# Patient Record
Sex: Male | Born: 1970 | Race: Black or African American | Hispanic: No | Marital: Single | Smoking: Current every day smoker
Health system: Southern US, Community
[De-identification: ages and names within clinical notes are randomized; demographics above are authoritative.]

## PROBLEM LIST (undated history)

## (undated) DIAGNOSIS — I10 Essential (primary) hypertension: Secondary | ICD-10-CM

## (undated) DIAGNOSIS — Y249XXA Unspecified firearm discharge, undetermined intent, initial encounter: Secondary | ICD-10-CM

## (undated) DIAGNOSIS — K911 Postgastric surgery syndromes: Secondary | ICD-10-CM

## (undated) DIAGNOSIS — W3400XA Accidental discharge from unspecified firearms or gun, initial encounter: Secondary | ICD-10-CM

## (undated) HISTORY — PX: ABDOMINAL SURGERY: SHX537

## (undated) HISTORY — PX: COLOSTOMY REVERSAL: SHX5782

## (undated) HISTORY — PX: COLOSTOMY: SHX63

---

## 2010-01-26 ENCOUNTER — Emergency Department (HOSPITAL_COMMUNITY): Admission: EM | Admit: 2010-01-26 | Discharge: 2010-01-26 | Payer: Self-pay | Admitting: Emergency Medicine

## 2010-07-24 LAB — COMPREHENSIVE METABOLIC PANEL
ALT: 18 U/L (ref 0–53)
AST: 40 U/L — ABNORMAL HIGH (ref 0–37)
Albumin: 3.8 g/dL (ref 3.5–5.2)
CO2: 27 mEq/L (ref 19–32)
Calcium: 8.9 mg/dL (ref 8.4–10.5)
GFR calc Af Amer: >60 mL/min (ref >60)
GFR calc non Af Amer: 58 mL/min — ABNORMAL LOW (ref >60)
Sodium: 137 mEq/L (ref 135–145)

## 2010-07-24 LAB — CBC
Hemoglobin: 12.8 g/dL — ABNORMAL LOW (ref 13.0–17.0)
MCHC: 34.7 g/dL (ref 30.0–36.0)
RBC: 4.39 MIL/uL (ref 4.22–5.81)

## 2010-08-27 ENCOUNTER — Emergency Department (HOSPITAL_COMMUNITY)
Admission: EM | Admit: 2010-08-27 | Discharge: 2010-08-27 | Disposition: A | Discharge status: Home / Self Care | Payer: Self-pay | Attending: Emergency Medicine | Admitting: Emergency Medicine

## 2010-08-27 DIAGNOSIS — H5710 Ocular pain, unspecified eye: Secondary | ICD-10-CM | POA: Insufficient documentation

## 2010-08-27 DIAGNOSIS — H53149 Visual discomfort, unspecified: Secondary | ICD-10-CM | POA: Insufficient documentation

## 2010-08-27 DIAGNOSIS — F411 Generalized anxiety disorder: Secondary | ICD-10-CM | POA: Insufficient documentation

## 2010-08-27 DIAGNOSIS — T1500XA Foreign body in cornea, unspecified eye, initial encounter: Secondary | ICD-10-CM | POA: Insufficient documentation

## 2010-08-27 DIAGNOSIS — T1590XA Foreign body on external eye, part unspecified, unspecified eye, initial encounter: Secondary | ICD-10-CM | POA: Insufficient documentation

## 2010-08-27 DIAGNOSIS — H5789 Other specified disorders of eye and adnexa: Secondary | ICD-10-CM | POA: Insufficient documentation

## 2010-08-27 DIAGNOSIS — F319 Bipolar disorder, unspecified: Secondary | ICD-10-CM | POA: Insufficient documentation

## 2010-08-27 DIAGNOSIS — H11419 Vascular abnormalities of conjunctiva, unspecified eye: Secondary | ICD-10-CM | POA: Insufficient documentation

## 2010-08-27 DIAGNOSIS — H538 Other visual disturbances: Secondary | ICD-10-CM | POA: Insufficient documentation

## 2010-11-15 ENCOUNTER — Emergency Department (HOSPITAL_COMMUNITY)
Admission: EM | Admit: 2010-11-15 | Discharge: 2010-11-15 | Disposition: A | Discharge status: Home / Self Care | Payer: Self-pay | Attending: Emergency Medicine | Admitting: Emergency Medicine

## 2010-11-15 DIAGNOSIS — X58XXXA Exposure to other specified factors, initial encounter: Secondary | ICD-10-CM | POA: Insufficient documentation

## 2010-11-15 DIAGNOSIS — T148XXA Other injury of unspecified body region, initial encounter: Secondary | ICD-10-CM | POA: Insufficient documentation

## 2010-11-15 DIAGNOSIS — M549 Dorsalgia, unspecified: Secondary | ICD-10-CM | POA: Insufficient documentation

## 2015-06-14 ENCOUNTER — Encounter (HOSPITAL_COMMUNITY): Payer: Self-pay | Admitting: Emergency Medicine

## 2015-06-14 ENCOUNTER — Emergency Department (HOSPITAL_COMMUNITY)
Admission: EM | Admit: 2015-06-14 | Discharge: 2015-06-15 | Disposition: A | Discharge status: Home / Self Care | Payer: Self-pay | Attending: Emergency Medicine | Admitting: Emergency Medicine

## 2015-06-14 ENCOUNTER — Emergency Department (HOSPITAL_COMMUNITY): Payer: Self-pay

## 2015-06-14 DIAGNOSIS — Z87828 Personal history of other (healed) physical injury and trauma: Secondary | ICD-10-CM | POA: Insufficient documentation

## 2015-06-14 DIAGNOSIS — Z79899 Other long term (current) drug therapy: Secondary | ICD-10-CM | POA: Insufficient documentation

## 2015-06-14 DIAGNOSIS — R079 Chest pain, unspecified: Secondary | ICD-10-CM | POA: Insufficient documentation

## 2015-06-14 DIAGNOSIS — F17210 Nicotine dependence, cigarettes, uncomplicated: Secondary | ICD-10-CM | POA: Insufficient documentation

## 2015-06-14 DIAGNOSIS — R202 Paresthesia of skin: Secondary | ICD-10-CM | POA: Insufficient documentation

## 2015-06-14 DIAGNOSIS — I10 Essential (primary) hypertension: Secondary | ICD-10-CM | POA: Insufficient documentation

## 2015-06-14 DIAGNOSIS — R253 Fasciculation: Secondary | ICD-10-CM | POA: Insufficient documentation

## 2015-06-14 DIAGNOSIS — H9312 Tinnitus, left ear: Secondary | ICD-10-CM | POA: Insufficient documentation

## 2015-06-14 HISTORY — DX: Essential (primary) hypertension: I10

## 2015-06-14 HISTORY — DX: Accidental discharge from unspecified firearms or gun, initial encounter: W34.00XA

## 2015-06-14 HISTORY — DX: Unspecified firearm discharge, undetermined intent, initial encounter: Y24.9XXA

## 2015-06-14 LAB — CBC
HEMATOCRIT: 41.9 % (ref 39.0–52.0)
HEMOGLOBIN: 14.1 g/dL (ref 13.0–17.0)
MCH: 28.1 pg (ref 26.0–34.0)
MCHC: 33.7 g/dL (ref 30.0–36.0)
MCV: 83.6 fL (ref 78.0–100.0)
Platelets: 216 10*3/uL (ref 150–400)
RBC: 5.01 MIL/uL (ref 4.22–5.81)
RDW: 12.5 % (ref 11.5–15.5)
WBC: 4.2 10*3/uL (ref 4.0–10.5)

## 2015-06-14 LAB — BASIC METABOLIC PANEL
ANION GAP: 11 (ref 5–15)
BUN: 5 mg/dL — ABNORMAL LOW (ref 6–20)
CALCIUM: 9.5 mg/dL (ref 8.9–10.3)
CO2: 29 mmol/L (ref 22–32)
Chloride: 101 mmol/L (ref 101–111)
Creatinine, Ser: 1.41 mg/dL — ABNORMAL HIGH (ref 0.61–1.24)
GFR calc non Af Amer: 60 mL/min — ABNORMAL LOW (ref >60)
GLUCOSE: 97 mg/dL (ref 65–99)
POTASSIUM: 4 mmol/L (ref 3.5–5.1)
Sodium: 141 mmol/L (ref 135–145)

## 2015-06-14 LAB — I-STAT TROPONIN, ED
TROPONIN I, POC: 0 ng/mL (ref 0.00–0.08)
Troponin i, poc: 0 ng/mL (ref 0.00–0.08)

## 2015-06-14 MED ORDER — DIAZEPAM 5 MG PO TABS
5.0000 mg | ORAL_TABLET | Freq: Once | ORAL | Status: AC
  Administered 2015-06-14: 5 mg via ORAL
  Filled 2015-06-14: qty 1

## 2015-06-14 NOTE — ED Notes (Signed)
Pt from home for eval of sudden onset of tingling to bilateral arms and legs that went into his chest yesterday as pt was resting. Pt denies any cp at this time but reports tingling to fingertips. nad noted. Alert and oriented.
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
## 2015-06-15 MED ORDER — DIAZEPAM 5 MG PO TABS: 5.0000 mg | ORAL_TABLET | Freq: Two times a day (BID) | ORAL | Status: DC | PRN

## 2015-06-15 NOTE — Discharge Instructions (Signed)
Take the prescribed medication as directed for muscle spasm/twitches. Follow-up with your primary care physician.  If you do not have one, see resource guide. Return to the ED for new or worsening symptoms.   Emergency Department Resource Guide 1) Find a Doctor and Pay Out of Pocket Although you won't have to find out who is covered by your insurance plan, it is a good idea to ask around and get recommendations. You will then need to call the office and see if the doctor you have chosen will accept you as a new patient and what types of options they offer for patients who are self-pay. Some doctors offer discounts or will set up payment plans for their patients who do not have insurance, but you will need to ask so you aren't surprised when you get to your appointment.  2) Contact Your Local Health Department Not all health departments have doctors that can see patients for sick visits, but many do, so it is worth a call to see if yours does. If you don't know where your local health department is, you can check in your phone book. The CDC also has a tool to help you locate your state's health department, and many state websites also have listings of all of their local health departments.  3) Find a Walk-in Clinic If your illness is not likely to be very severe or complicated, you may want to try a walk in clinic. These are popping up all over the country in pharmacies, drugstores, and shopping centers. They're usually staffed by nurse practitioners or physician assistants that have been trained to treat common illnesses and complaints. They're usually fairly quick and inexpensive. However, if you have serious medical issues or chronic medical problems, these are probably not your best option.  No Primary Care Doctor: - Call Health Connect at  [phone] - they can help you locate a primary care doctor that  accepts your insurance, provides certain services, etc. - Physician Referral Service-  [phone]  Chronic Pain Problems: Organization         Address  Phone   Notes  [facility]  [phone] Patients need to be referred by their primary care doctor.   Medication Assistance: Organization         Address  Phone   Notes  [facility] [address] [place] [place], [place] [address] [phone] --Must be a resident of [facility] -- Must have NO insurance coverage whatsoever (no Medicaid/ Medicare, etc.) -- The pt. MUST have a primary care doctor that directs their care regularly and follows them in the community   [facility]ist  [phone]   [employer]  [phone]    Agencies that provide inexpensive medical care: Organization         Address  Phone   Notes  [facility]  [phone]   [facility]    [phone]   [facility] [address] [place], [place] [address] [phone]   [facility] [address] [place] [place] [phone]   Planned Parenthood    [phone]   [facility]    [phone]   [facility]  [address], [place] Phone:  [phone], Fax:  [phone] Hours of Operation:  9 am - 6 pm, M-F.  Also accepts Medicaid/Medicare and self-pay.  [facility]  [address], [place] Phone: [phone], Fax: [phone]. Hours of Operation:  8:30 am - 5:30 pm, M-F.  Also accepts Medicaid and self-pay.  [facility] [place] [address] [place] Phone: [phone]   [place], [place], [place] [phone], Ext. [phone] Mondays & Thursdays: 7-9 AM.  First 15 patients are seen on a first come, first serve basis.    [place] Providers:  Organization         Address  Phone   Notes  [facility] [address],  [place] [phone] Also accepts self-pay patients.  [facility] [id number] [address]. [address]. Garcia, [place]  [phone]   [place], [address], [place] [phone]   [facility] [address] [place] [phone]   Lucianne Lei [address], [place]   [phone] Only accepts [place] Access [place] patients after they have their name applied to their card.   Self-Pay (no insurance) in [facility]:  Organization         Address  Phone   Notes  Sickle Cell Patients, [facility] Internal Medicine [place] [phone]   [facility] Urgent Care [place] [phone]   [facility] [place]  [place], [place], [place] [phone]   [facility]/Dr. Osei-Bonsu  [address] [place] or [address], [place] [phone] Phone number for both [place] and [place] locations is the same.  [facility] [address] [place] [phone]   [facility] [address] [place] or [address] [place] [address] [phone]   [facility] [address] [place] [phone], phone; [phone], fax Sees patients 1st and 3rd Saturday of every month.  Must not qualify for public or private insurance (i.e. Medicaid, Medicare, [place] Health Choice, Veterans' Benefits)  Household income should be no more than 200% of the poverty level The clinic cannot treat you if you are pregnant or think you are pregnant  Sexually transmitted diseases are not treated at the clinic.    Dental Care: Organization         Address  Phone  Notes  [facility] Department of [facility] [phone] Accepts children up to age 64 who are enrolled in  [place] or [place]; pregnant women with a Medicaid card; and children who have applied for Medicaid or [place] Health Choice, but were declined, whose parents can pay a reduced fee at time of service.  [facility] Department of [facility]  [address], [place] [phone] Accepts children up to age 59 who are enrolled in [place] or [place]; pregnant women with a Medicaid card; and children who have applied for Medicaid or [place] Health Choice, but were declined, whose parents can pay a reduced fee at time of service.  [facility]  [place] [phone] Patients are seen by appointment only. Walk-ins are not accepted. [place] will see patients 70 years of age and older. Monday - Tuesday (8am-5pm) Most Wednesdays (8:30-5pm) $30 per visit, cash only  [facility]  [address], [facility] [phone] Patients are seen by appointment only. Walk-ins are not accepted. [place] will see patients 55 years of age and older. One Wednesday Evening (Monthly: Volunteer Based).  $30 per visit, cash only  [place]  [phone] for adults; Children under age 16, call Graduate Pediatric Dentistry at [phone]. Children aged 15-14, please call [phone] to request a pediatric application.  Dental services are provided in all areas of dental care including fillings, crowns and bridges, complete and partial dentures, implants, gum treatment, root canals, and extractions. Preventive care is also provided. Treatment is provided to both adults and children. Patients are selected via a lottery and there is often a waiting list.   [facility] [address] [place]  [phone] [email or web address]   [facility] [address] [place], [place] [phone], Ext. [phone] Second and Fourth Thursday of each month, opens at 6:30  AM; Clinic ends at 9 AM.  Patients are seen on a first-come first-served basis, and a limited number are seen during each clinic.   [facility]  [address] [place]ard Danker [place], [place] [phone]   Eligibility Requirements You must have lived in [place], [place], or [place] counties for at least the last three months.   You cannot be eligible for state or federal sponsored [employer], including [employer], [place], or [employer].   You generally cannot be eligible for healthcare insurance through your employer.    How to apply: Eligibility screenings are held every Tuesday and Wednesday afternoon from 1:00 pm until 4:00 pm. You do not need an appointment for the interview!  [facility] [address] [place], [place]   [employer]  [place] Department  [place]  [phone]    Behavioral Health Resources in the Community: Intensive Outpatient Programs Organization         Address  Phone  Notes  [place]. [address], [place], [place] [phone]   [facility] [address], [place], [employer]   ADS: Alcohol & Drug S[facility] [address], [place], [place]   [place] [address]  [place], [place] or [phone]   Substance Abuse Resources Organization         Address  Phone  Notes  Alcohol and Drug Services  [phone]   [place]  [phone]   [facility] [place]   Chinita Pester  [phone]   Residential & Outpatient Substance Abuse Program  [phone]   Psychological Services Organization         Address  Phone  Notes  [facility] [place]  [place]  [phone]   [place] [address] [place] [phone] or  [phone]    Mobile Crisis Teams Organization         Address  Phone  Notes  [facility], Mobile Crisis Care Unit  [phone]   [facility]  [address] [place], [place]   [place] [address] [place] [phone]    Self-Help/Support Groups Organization         Address  Phone             Notes  [facility]ental Health Assoc[facility] of [place] - variety of support groups  [place] Call for more information  Narcotics Anonymous (NA), [facility] [address], [employer] [place]  2 meetings at this location   Special educational needs teacher         Address  Phone  Notes  [facility] [place],    [place]  [phone]   [facility]  [address], [place] [address], [place], [place]   [place], [place] [phone] Admissions: 8am-3pm M-F  Incentives Substance [place] [address],    [place], [place] [phone]   [facility] [address] [place], [place], [place]   [facility] [address],  [place], [place]   [facility] - Intensive Outpatient [address], [place], [place]   [facility] ([place].) [place].,  [place], [place] [phone] or [phone]   [facility] ([facility]S) [address] [place], [place] Accepts Medicaid  [facility] [place] [address]  [place] [phone] Substance Abuse/Addiction Treatment   [facility] Organization         Address  Phone  Notes  [facility]  [phone]   Domenic Schwab, PhD [address] Arlis Porta [place], [place]   [phone] or [phone]   [place], [place] [phone]   [facility] [address],  [place], [place] [phone] Insurance/Medicaid/sponsorship through [facility] and Families [address]                                    [place], [place] [phone] [place] [address], [place] [phone]    Dr. Adele Schilder  [phone]   [facility] Dept. 1) [address] [place] 2) [place] 3)  [place] [address], [place] [phone]  [phone]   [place] [phone] or [phone] (After Hours)

## 2015-06-15 NOTE — ED Notes (Signed)
Pt left with all his belongings and ambulated out of the treatment area.  

## 2015-07-20 ENCOUNTER — Encounter (HOSPITAL_COMMUNITY): Payer: Self-pay

## 2015-07-20 ENCOUNTER — Emergency Department (HOSPITAL_COMMUNITY)
Admission: EM | Admit: 2015-07-20 | Discharge: 2015-07-20 | Disposition: A | Discharge status: Home / Self Care | Payer: Self-pay | Attending: Emergency Medicine | Admitting: Emergency Medicine

## 2015-07-20 DIAGNOSIS — F17210 Nicotine dependence, cigarettes, uncomplicated: Secondary | ICD-10-CM | POA: Insufficient documentation

## 2015-07-20 DIAGNOSIS — T889XXA Complication of surgical and medical care, unspecified, initial encounter: Secondary | ICD-10-CM

## 2015-07-20 DIAGNOSIS — H18821 Corneal disorder due to contact lens, right eye: Secondary | ICD-10-CM | POA: Insufficient documentation

## 2015-07-20 DIAGNOSIS — Z87828 Personal history of other (healed) physical injury and trauma: Secondary | ICD-10-CM | POA: Insufficient documentation

## 2015-07-20 DIAGNOSIS — Z79899 Other long term (current) drug therapy: Secondary | ICD-10-CM | POA: Insufficient documentation

## 2015-07-20 DIAGNOSIS — I10 Essential (primary) hypertension: Secondary | ICD-10-CM | POA: Insufficient documentation

## 2015-07-20 MED ORDER — FLUORESCEIN SODIUM 1 MG OP STRP
1.0000 | ORAL_STRIP | Freq: Once | OPHTHALMIC | Status: AC
  Administered 2015-07-20: 1 via OPHTHALMIC
  Filled 2015-07-20: qty 1

## 2015-07-20 MED ORDER — TETRACAINE HCL 0.5 % OP SOLN
1.0000 [drp] | Freq: Once | OPHTHALMIC | Status: AC
  Administered 2015-07-20: 1 [drp] via OPHTHALMIC
  Filled 2015-07-20: qty 2

## 2015-07-20 MED ORDER — FLUORESCEIN SODIUM 1 MG OP STRP: 1.0000 | ORAL_STRIP | Freq: Once | OPHTHALMIC | Status: DC

## 2015-07-20 MED ORDER — CIPROFLOXACIN HCL 0.3 % OP SOLN: 1.0000 [drp] | OPHTHALMIC | Status: DC
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
## 2015-07-20 NOTE — Discharge Instructions (Signed)
Schedule a follow-up appointment with your ophthalmologist for a visit in the next 2-3 days. Return to the emergency department with worsening pain in the eye, worsening vision, loss of vision, redness of the eye, purulent discharge from the eye, or any new or concerning symptoms.  Ciprofloxacin eye solution What is this medicine? CIPROFLOXACIN (sip roe FLOX a sin) is a quinolone antibiotic. It is used to treat bacterial eye infections. This medicine may be used for other purposes; ask your health care provider or pharmacist if you have questions. What should I tell my health care provider before I take this medicine? They need to know if you have any of these conditions: -contact lens wearer -an unusual or allergic reaction to ciprofloxacin, other antibiotics or medicines, foods, dyes, or preservatives -pregnant or trying to get pregnant -breast-feeding How should I use this medicine? This medicine is only for use in the eye. Follow the directions on the prescription label. Wash hands before and after use. Try not to touch the tip of the dropper to anything, even your eye or fingertips.Tilt your head back slightly and pull your lower eyelid down with your index finger to form a pouch. Squeeze the prescribed number of drops into the pouch. Close the eye gently to spread the drops. Your vision may blur for a few minutes. Use your doses at regular intervals. Do not use your medicine more often than directed. Finish the full course that is prescribed even if you think your condition is better. Do not skip doses or stop your medicine early. Talk to your pediatrician regarding the use of this medicine in children. Special care may be needed. Overdosage: If you think you have taken too much of this medicine contact a poison control center or emergency room at once. NOTE: This medicine is only for you. Do not share this medicine with others. What if I miss a dose? If you miss a dose, use it as soon as you  can. If it is almost time for your next dose, use only that dose. Do not use double or extra doses. What may interact with this medicine? Interactions are not expected. Do not use any other eye products without telling your doctor or health care professional. This list may not describe all possible interactions. Give your health care provider a list of all the medicines, herbs, non-prescription drugs, or dietary supplements you use. Also tell them if you smoke, drink alcohol, or use illegal drugs. Some items may interact with your medicine. What should I watch for while using this medicine? Tell your doctor or health care professional if your symptoms do not improve in 2 to 3 days or if they get worse. If your eyes are more sensitive to light, wear sunglasses. Do not wear contact lenses while you have any signs or symptoms of an eye infection. Ask your doctor or health care professional when you can start wearing your contacts again. Stop using this medicine immediately if you notice signs of an allergic reaction. What side effects may I notice from receiving this medicine? Side effects that you should report to your doctor or health care professional as soon as possible: -allergic reactions like skin rash, itching or hives, swelling of the face, lips, or tongue -blurred vision that does not go away Side effects that usually do not require medical attention (report to your doctor or health care professional if they continue or are bothersome): -temporary blurred vision -tearing or feeling of something in the eye This list  may not describe all possible side effects. Call your doctor for medical advice about side effects. You may report side effects to FDA at 1-800-FDA-1088. Where should I keep my medicine? Keep out of the reach of children. Store at room temperature between 2 and 25 degrees C (36 and 77 degrees F). Protect from light. Throw away any unused medicine after the expiration date. NOTE:  This sheet is a summary. It may not cover all possible information. If you have questions about this medicine, talk to your doctor, pharmacist, or health care provider.    2016, Elsevier/Gold Standard. (2007-08-02 14:07:51) Artificial Tears eye solution What is this medicine? ARTIFICIAL TEARS (ahr tuh FISH uhl teerz) eye solution soothes irritation and discomfort caused by dry eyes. This medicine may be used for other purposes; ask your health care provider or pharmacist if you have questions. What should I tell my health care provider before I take this medicine? -change in vision -eye infection or trauma -wear contact lenses -an unusual or allergic reaction to artificial tears, other medicines, foods, dyes, or preservatives -pregnant or trying to get pregnant -breast-feeding How should I use this medicine? This medicine is only for use in the eye. Do not take by mouth. Follow the directions on the label. Wash hands before and after use. Tilt the head back slightly and pull down the lower eyelid with your index finger to form a pouch. Try not to touch the tip of the dropper to your eye, fingertips, or any other surface. Squeeze the prescribed number of drops (usually one or two drops) into the pouch. Close the eye gently for a few moments to allow the drops to be in contact with the eye. Use your medicine at regular intervals. Do not use your medicine more often than directed. Talk to your pediatrician regarding the use of this medicine in children. While this medicine may be used in children as young as 6 years for selected conditions, precautions do apply. Overdosage: If you think you have taken too much of this medicine contact a poison control center or emergency room at once. NOTE: This medicine is only for you. Do not share this medicine with others. What if I miss a dose? If you miss a dose, use it as soon as you can. If it is almost time for your next dose, use only that dose. Do not  use double or extra doses. What may interact with this medicine? Interactions are not expected. If you are using other eye drops with this medicine, separate the application of the different eye drops by roughly 5 minutes. This ensures that the eye drops do not interfere with each other. If you are using both eye drops and an eye ointment, use the eye drops 10 minutes before the eye ointment so that the eye ointment does not interfere with the action of the drops. This list may not describe all possible interactions. Give your health care provider a list of all the medicines, herbs, non-prescription drugs, or dietary supplements you use. Also tell them if you smoke, drink alcohol, or use illegal drugs. Some items may interact with your medicine. What should I watch for while using this medicine? If you experience eye pain, changes in vision, continued redness or irritation of the eye, or if your eye condition gets worse or lasts longer than 72 hours, discontinue use and consult your health care professional. To avoid contamination of this product, do not touch the tip of the container to any surface.  Do not share this medicine with others. If the product changes color or becomes cloudy, do not use. If you wear contact lenses, you should remove them before putting the drops in your eyes. Wait at least 15 minutes after putting the drops in your eyes before putting your contact lenses back in. What side effects may I notice from receiving this medicine? Side effects that you should report to your doctor or health care professional as soon as possible: -allergic reactions like skin rash, itching or hives, swelling of the face, lips, or tongue -change in vision -eye irritation or redness that gets worse or lasts more than 72 hours -eye pain Side effects that usually do not require medical attention (report to your doctor or health care professional if they continue or are bothersome): -temporary stinging  or blurred vision when applying the eye drops This list may not describe all possible side effects. Call your doctor for medical advice about side effects. You may report side effects to FDA at 1-800-FDA-1088. Where should I keep my medicine? Keep out of the reach of children. Store at room temperature between 15 and 30 degrees C (59 and 86 degrees F). Do not freeze. Throw away any unused medicine after the expiration date. Once the product is opened, most experts recommend discarding the product after 30 days. NOTE: This sheet is a summary. It may not cover all possible information. If you have questions about this medicine, talk to your doctor, pharmacist, or health care provider.    2016, Elsevier/Gold Standard. (2007-10-28 14:24:03)
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
## 2015-08-04 ENCOUNTER — Encounter (HOSPITAL_COMMUNITY): Payer: Self-pay | Admitting: Emergency Medicine

## 2015-08-04 ENCOUNTER — Emergency Department (HOSPITAL_COMMUNITY)
Admission: EM | Admit: 2015-08-04 | Discharge: 2015-08-05 | Disposition: A | Discharge status: Home / Self Care | Payer: Self-pay | Attending: Emergency Medicine | Admitting: Emergency Medicine

## 2015-08-04 DIAGNOSIS — R200 Anesthesia of skin: Secondary | ICD-10-CM | POA: Insufficient documentation

## 2015-08-04 DIAGNOSIS — Z87828 Personal history of other (healed) physical injury and trauma: Secondary | ICD-10-CM | POA: Insufficient documentation

## 2015-08-04 DIAGNOSIS — I10 Essential (primary) hypertension: Secondary | ICD-10-CM | POA: Insufficient documentation

## 2015-08-04 DIAGNOSIS — Z79899 Other long term (current) drug therapy: Secondary | ICD-10-CM | POA: Insufficient documentation

## 2015-08-04 DIAGNOSIS — F17210 Nicotine dependence, cigarettes, uncomplicated: Secondary | ICD-10-CM | POA: Insufficient documentation

## 2015-08-04 DIAGNOSIS — R42 Dizziness and giddiness: Secondary | ICD-10-CM | POA: Insufficient documentation

## 2015-08-04 LAB — BASIC METABOLIC PANEL
Anion gap: 7 (ref 5–15)
BUN: 11 mg/dL (ref 6–20)
CHLORIDE: 106 mmol/L (ref 101–111)
CO2: 26 mmol/L (ref 22–32)
CREATININE: 1.32 mg/dL — AB (ref 0.61–1.24)
Calcium: 9.2 mg/dL (ref 8.9–10.3)
GFR calc non Af Amer: >60 mL/min (ref >60)
Glucose, Bld: 106 mg/dL — ABNORMAL HIGH (ref 65–99)
POTASSIUM: 3.6 mmol/L (ref 3.5–5.1)
Sodium: 139 mmol/L (ref 135–145)

## 2015-08-04 LAB — CBC
HEMATOCRIT: 36.7 % — AB (ref 39.0–52.0)
Hemoglobin: 11.8 g/dL — ABNORMAL LOW (ref 13.0–17.0)
MCH: 27.1 pg (ref 26.0–34.0)
MCHC: 32.2 g/dL (ref 30.0–36.0)
MCV: 84.2 fL (ref 78.0–100.0)
PLATELETS: 230 10*3/uL (ref 150–400)
RBC: 4.36 MIL/uL (ref 4.22–5.81)
RDW: 12.8 % (ref 11.5–15.5)
WBC: 4.9 10*3/uL (ref 4.0–10.5)
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
## 2015-08-04 NOTE — ED Notes (Signed)
MD at bedside. 

## 2015-08-05 ENCOUNTER — Emergency Department (HOSPITAL_COMMUNITY): Payer: Self-pay

## 2015-08-05 LAB — URINALYSIS, ROUTINE W REFLEX MICROSCOPIC
Bilirubin Urine: NEGATIVE
GLUCOSE, UA: NEGATIVE mg/dL
Hgb urine dipstick: NEGATIVE
Ketones, ur: NEGATIVE mg/dL
LEUKOCYTES UA: NEGATIVE
Nitrite: NEGATIVE
PH: 7 (ref 5.0–8.0)
Protein, ur: NEGATIVE mg/dL
SPECIFIC GRAVITY, URINE: 1.007 (ref 1.005–1.030)

## 2015-08-05 LAB — CBG MONITORING, ED: Glucose-Capillary: 80 mg/dL (ref 65–99)

## 2015-08-05 NOTE — ED Notes (Signed)
Patient transported to X-ray 

## 2015-08-05 NOTE — Discharge Instructions (Signed)
Dizziness [name], your work up today was normal.  This is likely a side effect from your medication.  Call your primary care doctor first thing in the morning to obtain a new medicine.  If any symptoms worsen, come back to the ED immediately. Thank you. Dizziness is a common problem. It makes you feel unsteady or lightheaded. You may feel like you are about to pass out (faint). Dizziness can lead to injury if you stumble or fall. Anyone can get dizzy, but dizziness is more common in older adults. This condition can be caused by a number of things, including:  Medicines.  Dehydration.  Illness. HOME CARE Following these instructions may help with your condition: Eating and Drinking  Drink enough fluid to keep your pee (urine) clear or pale yellow. This helps to keep you from getting dehydrated. Try to drink more clear fluids, such as water.  Do not drink alcohol.  Limit how much caffeine you drink or eat if told by your doctor.  Limit how much salt you drink or eat if told by your doctor. Activity  Avoid making quick movements.  When you stand up from sitting in a chair, steady yourself until you feel okay.  In the morning, first sit up on the side of the bed. When you feel okay, stand slowly while you hold onto something. Do this until you know that your balance is fine.  Move your legs often if you need to stand in one place for a long time. Tighten and relax your muscles in your legs while you are standing.  Do not drive or use heavy machinery if you feel dizzy.  Avoid bending down if you feel dizzy. Place items in your home so that they are easy for you to reach without leaning over. Lifestyle  Do not use any tobacco products, including cigarettes, chewing tobacco, or electronic cigarettes. If you need help quitting, ask your doctor.  Try to lower your stress level, such as with yoga or meditation. Talk with your doctor if you need help. General Instructions  Watch  your dizziness for any changes.  Take medicines only as told by your doctor. Talk with your doctor if you think that your dizziness is caused by a medicine that you are taking.  Tell a friend or a family member that you are feeling dizzy. If he or she notices any changes in your behavior, have this person call your doctor.  Keep all follow-up visits as told by your doctor. This is important. GET HELP IF:  Your dizziness does not go away.  Your dizziness or light-headedness gets worse.  You feel sick to your stomach (nauseous).  You have trouble hearing.  You have new symptoms.  You are unsteady on your feet or you feel like the room is spinning. GET HELP RIGHT AWAY IF:  You throw up (vomit) or have diarrhea and are unable to eat or drink anything.  You have trouble:  Talking.  Walking.  Swallowing.  Using your arms, hands, or legs.  You feel generally weak.  You are not thinking clearly or you have trouble forming sentences. It may take a friend or family member to notice this.  You have:  Chest pain.  Pain in your belly (abdomen).  Shortness of breath.  Sweating.  Your vision changes.  You are bleeding.  You have a headache.  You have neck pain or a stiff neck.  You have a fever.   This information is not intended  to replace advice given to you by your health care provider. Make sure you discuss any questions you have with your health care provider.   Document Released: 04/16/2011 Document Revised: 09/11/2014 Document Reviewed: 04/23/2014 Elsevier Interactive Patient Education [employer]

## 2015-10-06 ENCOUNTER — Encounter (HOSPITAL_COMMUNITY): Payer: Self-pay | Admitting: *Deleted

## 2015-10-06 ENCOUNTER — Ambulatory Visit (HOSPITAL_COMMUNITY)
Admission: EM | Admit: 2015-10-06 | Discharge: 2015-10-06 | Disposition: A | Discharge status: Home / Self Care | Payer: Self-pay | Attending: Emergency Medicine | Admitting: Emergency Medicine

## 2015-10-06 DIAGNOSIS — K648 Other hemorrhoids: Secondary | ICD-10-CM

## 2015-10-06 DIAGNOSIS — K644 Residual hemorrhoidal skin tags: Secondary | ICD-10-CM

## 2015-10-06 HISTORY — DX: Postgastric surgery syndromes: K91.1

## 2015-10-06 MED ORDER — HYDROCORTISONE 2.5 % RE CREA: TOPICAL_CREAM | RECTAL | Status: AC
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
## 2015-10-06 NOTE — Discharge Instructions (Signed)
You have an inflamed hemorrhoid. Use the Anusol cream twice a day for the next week. You can also do sitz baths for comfort. This is likely flared up from the straining. Diarrhea can also irritate hemorrhoids. I would try adding some fiber to your diet. Try doing Metamucil every other day to see if that will firm up your stools without causing hard stools.
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
## 2015-10-29 ENCOUNTER — Encounter (HOSPITAL_COMMUNITY): Payer: Self-pay | Admitting: Emergency Medicine

## 2015-10-29 ENCOUNTER — Ambulatory Visit (HOSPITAL_COMMUNITY)
Admission: EM | Admit: 2015-10-29 | Discharge: 2015-10-29 | Disposition: A | Discharge status: Home / Self Care | Payer: Self-pay | Attending: Emergency Medicine | Admitting: Emergency Medicine

## 2015-10-29 DIAGNOSIS — H18822 Corneal disorder due to contact lens, left eye: Secondary | ICD-10-CM

## 2015-10-29 MED ORDER — GENTAMICIN SULFATE 0.3 % OP SOLN: 2.0000 [drp] | OPHTHALMIC | Status: AC

## 2015-10-29 MED ORDER — EYE WASH OPHTH SOLN
OPHTHALMIC | Status: AC
  Filled 2015-10-29: qty 118

## 2015-10-29 MED ORDER — TETRACAINE HCL 0.5 % OP SOLN
OPHTHALMIC | Status: AC
  Filled 2015-10-29: qty 2

## 2015-10-29 NOTE — Discharge Instructions (Signed)
Please be sure to follow up with an eye doctor in 2-3 days for recheck of symptoms.  You should try over the counter artificial tears to help keep your eyes moist and make it easier to take your contacts out.  Please leave out your Left contact until pain has resolved, antibiotics are completed and preferrably after recheck with an eye doctor to ensure proper and full healing.  You may use eye glasses in the meantime.  Please check your local pharmacy, whole sale food store such as [employer] [facility] or [facility], to see if they have eye doctors who can prescribe you new contacts or glasses.
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
## 2015-10-29 NOTE — ED Notes (Signed)
[facility] a complaint of both contacts in his right and left eye being in and unable to be removed for 2 days.

## 2015-11-14 ENCOUNTER — Encounter (HOSPITAL_COMMUNITY): Payer: Self-pay

## 2015-11-14 ENCOUNTER — Emergency Department (HOSPITAL_COMMUNITY)
Admission: EM | Admit: 2015-11-14 | Discharge: 2015-11-14 | Disposition: A | Discharge status: Home / Self Care | Payer: Self-pay | Attending: Emergency Medicine | Admitting: Emergency Medicine

## 2015-11-14 ENCOUNTER — Emergency Department (HOSPITAL_COMMUNITY): Payer: Self-pay

## 2015-11-14 DIAGNOSIS — F17210 Nicotine dependence, cigarettes, uncomplicated: Secondary | ICD-10-CM | POA: Insufficient documentation

## 2015-11-14 DIAGNOSIS — Z79899 Other long term (current) drug therapy: Secondary | ICD-10-CM | POA: Insufficient documentation

## 2015-11-14 DIAGNOSIS — I10 Essential (primary) hypertension: Secondary | ICD-10-CM | POA: Insufficient documentation

## 2015-11-14 DIAGNOSIS — R202 Paresthesia of skin: Secondary | ICD-10-CM

## 2015-11-14 DIAGNOSIS — R209 Unspecified disturbances of skin sensation: Secondary | ICD-10-CM | POA: Insufficient documentation

## 2015-11-14 LAB — CBC WITH DIFFERENTIAL/PLATELET
BASOS PCT: 0 %
Basophils Absolute: 0 10*3/uL (ref 0.0–0.1)
Eosinophils Absolute: 0 10*3/uL (ref 0.0–0.7)
Eosinophils Relative: 1 %
HEMATOCRIT: 40.5 % (ref 39.0–52.0)
Hemoglobin: 13.2 g/dL (ref 13.0–17.0)
LYMPHS PCT: 30 %
Lymphs Abs: 1.5 10*3/uL (ref 0.7–4.0)
MCH: 28.1 pg (ref 26.0–34.0)
MCHC: 32.6 g/dL (ref 30.0–36.0)
MCV: 86.2 fL (ref 78.0–100.0)
MONO ABS: 0.4 10*3/uL (ref 0.1–1.0)
MONOS PCT: 8 %
NEUTROS ABS: 3.1 10*3/uL (ref 1.7–7.7)
Neutrophils Relative %: 61 %
Platelets: 171 10*3/uL (ref 150–400)
RBC: 4.7 MIL/uL (ref 4.22–5.81)
RDW: 12.4 % (ref 11.5–15.5)
WBC: 5.1 10*3/uL (ref 4.0–10.5)

## 2015-11-14 LAB — URINALYSIS, ROUTINE W REFLEX MICROSCOPIC
Bilirubin Urine: NEGATIVE
Glucose, UA: NEGATIVE mg/dL
Hgb urine dipstick: NEGATIVE
Ketones, ur: NEGATIVE mg/dL
NITRITE: NEGATIVE
PH: 7 (ref 5.0–8.0)
Protein, ur: NEGATIVE mg/dL
SPECIFIC GRAVITY, URINE: 1.007 (ref 1.005–1.030)

## 2015-11-14 LAB — BASIC METABOLIC PANEL
Anion gap: 5 (ref 5–15)
BUN: 6 mg/dL (ref 6–20)
CO2: 26 mmol/L (ref 22–32)
CREATININE: 1.32 mg/dL — AB (ref 0.61–1.24)
Calcium: 9.4 mg/dL (ref 8.9–10.3)
Chloride: 108 mmol/L (ref 101–111)
GFR calc Af Amer: >60 mL/min (ref >60)
GFR calc non Af Amer: >60 mL/min (ref >60)
GLUCOSE: 95 mg/dL (ref 65–99)
Potassium: 3.5 mmol/L (ref 3.5–5.1)
Sodium: 139 mmol/L (ref 135–145)

## 2015-11-14 LAB — URINE MICROSCOPIC-ADD ON

## 2015-11-14 NOTE — Discharge Instructions (Signed)
Call neurology to schedule a follow-up appointment. The contact information for [facility] neurolog[facility] has been provided in this discharge summary for you to call and make these arrangements.  Return to the emergency department if symptoms significantly worsen or change.   Paresthesia Paresthesia is an abnormal burning or prickling sensation. This sensation is generally felt in the hands, arms, legs, or feet. However, it may occur in any part of the body. Usually, it is not painful. The feeling may be described as:  Tingling or numbness.  Pins and needles.  Skin crawling.  Buzzing.  Limbs falling asleep.  Itching. Most people experience temporary (transient) paresthesia at some time in their lives. Paresthesia may occur when you breathe too quickly (hyperventilation). It can also occur without any apparent cause. Commonly, paresthesia occurs when pressure is placed on a nerve. The sensation quickly goes away after the pressure is removed. For some people, however, paresthesia is a long-lasting (chronic) condition that is caused by an underlying disorder. If you continue to have paresthesia, you may need further medical evaluation. HOME CARE INSTRUCTIONS Watch your condition for any changes. Taking the following actions may help to lessen any discomfort that you are feeling:  Avoid drinking alcohol.  Try acupuncture or massage to help relieve your symptoms.  Keep all follow-up visits as directed by your health care provider. This is important. SEEK MEDICAL CARE IF:  You continue to have episodes of paresthesia.  Your burning or prickling feeling gets worse when you walk.  You have pain, cramps, or dizziness.  You develop a rash. SEEK IMMEDIATE MEDICAL CARE IF:  You feel weak.  You have trouble walking or moving.  You have problems with speech, understanding, or vision.  You feel confused.  You cannot control your bladder or bowel movements.  You have numbness after an  injury.  You faint.   This information is not intended to replace advice given to you by your health care provider. Make sure you discuss any questions you have with your health care provider.   Document Released: 04/17/2002 Document Revised: 09/11/2014 Document Reviewed: 04/23/2014 Elsevier Interactive Patient Education [employer] Inc[employer]

## 2015-11-14 NOTE — ED Notes (Signed)
Pt presents with body twitching and generalized numbness for months.  Pt reports R shoulder pain that is intermittent for years.
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
## 2017-06-02 ENCOUNTER — Other Ambulatory Visit: Payer: Self-pay

## 2017-06-02 ENCOUNTER — Encounter (HOSPITAL_COMMUNITY): Payer: Self-pay | Admitting: Emergency Medicine

## 2017-06-02 ENCOUNTER — Emergency Department (HOSPITAL_COMMUNITY)
Admission: EM | Admit: 2017-06-02 | Discharge: 2017-06-02 | Disposition: A | Discharge status: Home / Self Care | Payer: Self-pay | Attending: Emergency Medicine | Admitting: Emergency Medicine

## 2017-06-02 DIAGNOSIS — Z5321 Procedure and treatment not carried out due to patient leaving prior to being seen by health care provider: Secondary | ICD-10-CM | POA: Insufficient documentation

## 2017-06-02 DIAGNOSIS — R1084 Generalized abdominal pain: Secondary | ICD-10-CM | POA: Insufficient documentation

## 2017-06-02 LAB — CBC
HEMATOCRIT: 41.4 % (ref 39.0–52.0)
HEMOGLOBIN: 14.6 g/dL (ref 13.0–17.0)
MCH: 32.3 pg (ref 26.0–34.0)
MCHC: 35.3 g/dL (ref 30.0–36.0)
MCV: 91.6 fL (ref 78.0–100.0)
Platelets: 208 10*3/uL (ref 150–400)
RBC: 4.52 MIL/uL (ref 4.22–5.81)
RDW: 13.5 % (ref 11.5–15.5)
WBC: 14.7 10*3/uL — ABNORMAL HIGH (ref 4.0–10.5)

## 2017-06-02 LAB — BASIC METABOLIC PANEL
ANION GAP: 11 (ref 5–15)
BUN: 17 mg/dL (ref 6–20)
CHLORIDE: 107 mmol/L (ref 101–111)
CO2: 21 mmol/L — ABNORMAL LOW (ref 22–32)
Calcium: 9.5 mg/dL (ref 8.9–10.3)
Creatinine, Ser: 1 mg/dL (ref 0.61–1.24)
GFR calc Af Amer: >60 mL/min (ref >60)
GFR calc non Af Amer: >60 mL/min (ref >60)
GLUCOSE: 108 mg/dL — AB (ref 65–99)
POTASSIUM: 3.8 mmol/L (ref 3.5–5.1)
Sodium: 139 mmol/L (ref 135–145)

## 2017-06-02 LAB — URINALYSIS, ROUTINE W REFLEX MICROSCOPIC
Bacteria, UA: NONE SEEN
Bilirubin Urine: NEGATIVE
GLUCOSE, UA: NEGATIVE mg/dL
HGB URINE DIPSTICK: NEGATIVE
Ketones, ur: NEGATIVE mg/dL
Nitrite: NEGATIVE
PH: 7 (ref 5.0–8.0)
Protein, ur: NEGATIVE mg/dL
SPECIFIC GRAVITY, URINE: 1.021 (ref 1.005–1.030)

## 2017-06-02 NOTE — ED Triage Notes (Signed)
Pt states he has a severe right flank pain that started today at work, pt denies any urinary symptoms, no fever or chills.

## 2017-06-02 NOTE — ED Notes (Signed)
Called x3 for room with no answer 

## 2017-06-02 NOTE — ED Provider Notes (Signed)
Patient eloped prior to formal evaluation.  I was unable to perform a history of physical exam.   Shon BatonHorton, Courtney F, MD 06/02/17 [phone]
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
